# Patient Record
Sex: Female | Born: 1965 | Race: White | Hispanic: No | Marital: Married
Health system: Southern US, Community
[De-identification: ages and names within clinical notes are randomized; demographics above are authoritative.]

---

## 2004-01-15 ENCOUNTER — Other Ambulatory Visit: Admission: RE | Admit: 2004-01-15 | Discharge: 2004-01-15 | Payer: Self-pay | Admitting: Obstetrics and Gynecology

## 2004-09-25 ENCOUNTER — Other Ambulatory Visit: Admission: RE | Admit: 2004-09-25 | Discharge: 2004-09-25 | Payer: Self-pay | Admitting: Obstetrics and Gynecology

## 2005-01-15 ENCOUNTER — Ambulatory Visit: Payer: Self-pay | Admitting: Obstetrics and Gynecology

## 2005-01-22 ENCOUNTER — Ambulatory Visit: Payer: Self-pay | Admitting: Obstetrics and Gynecology

## 2005-01-29 ENCOUNTER — Ambulatory Visit: Payer: Self-pay | Admitting: Obstetrics and Gynecology

## 2005-02-06 ENCOUNTER — Inpatient Hospital Stay (HOSPITAL_COMMUNITY): Admission: AD | Admit: 2005-02-06 | Discharge: 2005-02-13 | Payer: Self-pay | Admitting: Obstetrics and Gynecology

## 2005-02-06 ENCOUNTER — Ambulatory Visit: Payer: Self-pay | Admitting: *Deleted

## 2005-02-08 ENCOUNTER — Encounter (INDEPENDENT_AMBULATORY_CARE_PROVIDER_SITE_OTHER): Payer: Self-pay | Admitting: Specialist

## 2005-02-14 ENCOUNTER — Encounter: Admission: RE | Admit: 2005-02-14 | Discharge: 2005-03-15 | Payer: Self-pay | Admitting: Obstetrics and Gynecology

## 2005-03-16 ENCOUNTER — Encounter: Admission: RE | Admit: 2005-03-16 | Discharge: 2005-04-02 | Payer: Self-pay | Admitting: Obstetrics and Gynecology

## 2005-03-27 ENCOUNTER — Other Ambulatory Visit: Admission: RE | Admit: 2005-03-27 | Discharge: 2005-03-27 | Payer: Self-pay | Admitting: Obstetrics and Gynecology

## 2007-10-31 ENCOUNTER — Encounter: Admission: RE | Admit: 2007-10-31 | Discharge: 2007-10-31 | Payer: Self-pay | Admitting: Obstetrics and Gynecology

## 2008-08-08 ENCOUNTER — Encounter (INDEPENDENT_AMBULATORY_CARE_PROVIDER_SITE_OTHER): Payer: Self-pay | Admitting: Diagnostic Radiology

## 2008-08-08 ENCOUNTER — Encounter: Admission: RE | Admit: 2008-08-08 | Discharge: 2008-08-08 | Payer: Self-pay | Admitting: Obstetrics and Gynecology

## 2010-10-11 NOTE — Discharge Summary (Signed)
Sydney Gomez, Sydney Gomez               ACCOUNT NO.:  [id number]   MEDICAL RECORD NO.:  [id number]          PATIENT TYPE:  INP   LOCATION:  9124                          FACILITY:  [facility]   PHYSICIAN:  Guy Sandifer. Henderson Cloud, M.D. DATE OF BIRTH:  04-08-1966   DATE OF ADMISSION:  02/06/2005  DATE OF DISCHARGE:  02/13/2005                                 DISCHARGE SUMMARY   ADMITTING DIAGNOSES:  1.  Intrauterine pregnancy at 27 and one-seventh weeks estimated gestational      age.  2.  Twin gestation.  3.  Severe pregnancy-induced hypertension.   DISCHARGE DIAGNOSES:  1.  Status post low transverse cesarean section.  2.  Viable female infants.   PROCEDURE:  Primary low transverse cesarean section.   REASON FOR ADMISSION:  Please see dictated H&P.   HOSPITAL COURSE:  The patient was a 45 year old primigravida that was  admitted to [facility] at 5 and one-seventh weeks  estimated gestational age. The patient had been evaluated in the office and  was noted to have elevation in her blood pressure and proteinuria. The  patient was then hospitalized for observation and evaluation regarding  elevated blood pressure and proteinuria. The patient was also noted to have  some discordance in fetal growth with twin A in approximately the 30th  percentile and baby B in the 65th percentile. The patient was started on  betamethasone, a 24-hour urine collection for creatinine clearance and  protein, PIH labs were drawn, and the patient was placed on bedrest. On the  following morning, the patient was without complaint other than orange  urine. A 24-hour urine was ongoing. Blood pressure was noted to be 139/91.  Uterine contractions were noted to be every 3-5 minutes. Cervix was dilated  to 2+, 30% effaced, with vertex A at a -2 station. Later that afternoon, 24-  hour collection had been completed which revealed 3445 mg of protein.  Decision was made to schedule the patient for cesarean  delivery in the  morning and placed the patient on magnesium sulfate. On the following  morning, the patient was nauseated. Vital signs were stable. She was  afebrile. Blood pressure was stable. The patient was then transferred to the  operating room where spinal anesthesia was administered without difficulty.  A low transverse incision was made with the delivery of viable twin A, a  female infant weighing 3 pounds 14 ounces with Apgars of 7 at one minute and 9  at five minutes. Arterial cord pH was 7.35. Baby B, also female, weighing 4  pounds 5 ounces with Apgars of 9 at one minute and 9 at 5 minutes was  delivered without difficulty. The patient tolerated the procedure well and  was taken to the recovery room and later transferred to the AICU where  magnesium sulfate was administered. On postoperative day #1, the patient was  known to have some decrease in oxygen saturation. Chest x-ray had been  performed which revealed some mild pulmonary edema. Lasix had been given IV  with excellent results. The patient was now without complaint. Vital signs  were stable  with blood pressure 120/80. Oxygen saturation was 97% on 2 L.  Urine output was -400 mL over the last 8 hours. Lungs were clear to  auscultation bilaterally. Fundus was firm and nontender with good return of  bowel function. Abdominal dressing was noted to be clean, dry and intact.  Laboratory findings revealed hemoglobin of 9.6; platelet count of 208,000;  wbc count of 17.0. On the following morning, the patient was without  complaint. She denied any shortness of breath. Blood pressure was 162/105 to  151/94, temperature 100.3 maximum. Abdomen was soft. Fundus was firm with  moderate tenderness. Incision was clean, dry and intact. Liver function  tests were within normal limits. On postoperative day #3, the patient did  complain of a productive cough. She denied headache, blurred vision, or  right upper quadrant pain. Vital signs were  stable. She was afebrile. Blood  pressure 140-161 over 92-109. Deep tendon reflexes were 2+. Abdomen was  soft. Fundus was firm and nontender. Incision was clean, dry and intact.  Laboratory findings revealed hemoglobin of 9.0, platelet count of 200, wbc  count of 12.0, liver function tests were within normal limits, and uric acid  was 4.2. On postoperative day #4, the patient continued to complain of a  productive cough. Vital signs were stable. She was afebrile. Blood pressure  156/97. Deep tendon reflexes were 2+, no clonus. Abdomen was soft. Fundus  was firm and nontender. Incision was clean, dry and intact. Staples were  removed. Lungs were clear to auscultation. The patient was started on a Z-  Pak and continued on labetalol 100 mg twice a day. On postoperative day #5,  the patient did complain of a mild headache without blurred vision or  epigastric pain, cough was improving, vital signs were stable. Blood  pressure 146-170 over 92-104. Deep tendon reflexes were 1+. Abdomen was  soft. Fundus was firm and nontender. Incision was clean, dry and intact.  Staples had been removed. She was ambulating well. PIH labs were drawn prior  to discharge which revealed hemoglobin of 9.4; platelet count of 274,000;  wbc count of 11.5. Liver function tests were within normal limits.  Instructions were given and the patient was discharged home.   CONDITION ON DISCHARGE:  Stable.   DIET:  Regular as tolerated.   ACTIVITY:  No heavy lifting, no driving x2 weeks, no vaginal entry.   FOLLOW UP:  The patient is to follow up in the office in 1 week for an  incision check. She is to call for temperature greater than 100 degrees,  persistent nausea and vomiting, heavy vaginal bleeding, and/or redness or  drainage from the incisional site. The patient was also instructed to call  for headache, blurred vision, or right upper quadrant pain.   DISCHARGE MEDICATIONS: 1.  Tylox #30 one p.o. q.4-6h. p.r.n.   2.  Motrin 600 mg daily.  3.  Labetalol 200 mg one p.o. b.i.d.  4.  Prenatal vitamins one p.o. daily.  5.  Colace one p.o. daily p.r.n.      Julio Sicks, N.P.      Guy Sandifer. Henderson Cloud, M.D.  Electronically Signed    CC/MEDQ  D:  03/17/2005  T:  03/17/2005  Job:  [id number]

## 2010-10-11 NOTE — H&P (Signed)
NAMEDENNY, LAVE               ACCOUNT NO.:  [id number]   MEDICAL RECORD NO.:  [id number]          PATIENT TYPE:  INP   LOCATION:  9155                          FACILITY:  [facility]   PHYSICIAN:  Dineen Kid. Rana Snare, M.D.    DATE OF BIRTH:  10-23-1965   DATE OF ADMISSION:  02/06/2005  DATE OF DISCHARGE:                                HISTORY & PHYSICAL   HISTORY OF PRESENT ILLNESS:  Ms. Scaletta is a 45 year old, G1, P0, at 75 and  1/7ths estimated gestational age with an estimated date of confinement of  March 26, 2005, who was seen in the office today by Dr. Marcelle Overlie  for a routine obstetrical visit.  She has had elevated blood pressures over  the last 2-3 weeks.  Today, she has an elevated blood pressure of 148/90  with 2+ proteinuria.  The pregnancy is complicated by twin pregnancies.  Her  baseline blood pressures at 13 weeks were 110-120 systolic over 60-72  diastolic.  She began having occasional elevated blood pressures around 26-  28 weeks.  Ultrasound evaluation at 26-28 weeks reveals concordant growth  with baby A slightly smaller than baby B, ranging in the 59th to 72nd  percentile, and baby B ranging in the 75th to 79th percentile.  Recent  ultrasound on January 29, 2005 has shown baby A now in the 30th percentile  and baby B in the 65th percentile.  She has undergone first trimester  screening and level 2 ultrasound at [facility].  They are  monochorionic diamniotic twins with a thin membrane separating the twin  boys.  Today, she is asymptomatic for preeclampsia.   PAST MEDICAL HISTORY:  1.  Significant for hypothyroidism.  2.  Polycystic ovarian syndrome.   PAST SURGICAL HISTORY:  1.  She had a D&C at age 45.  2.  She has had arthroscopy of both knees.   PHYSICAL EXAMINATION:  VITAL SIGNS:  Blood pressure 140/90, 2+ protein.  Her  weight is 216.  Fetal heart tones were heard today.  PELVIC:  Cervix, per Dr. Vincente Poli, is 1, 30%, -2 station.   IMPRESSION  AND PLAN:  1.  Twin pregnancies at 72 and 1/7ths weeks.  2.  Elevated blood pressure with proteinuria.   PLAN:  Admission to the antenatal unit at [facility] for further  evaluation of preeclampsia and monitoring of the fetuses.  There is now  beginning some discordancy in growth of monochorionic twins, and a question  of decreased weight from the previous growth curve. Will plan betamethasone  12.5 mg IM q.24h., 24-hour urine for creatinine clearance and protein, PIH  lab evaluation, and bed rest.     Dineen Kid. Rana Snare, M.D.  Electronically Signed    DCL/MEDQ  D:  02/06/2005  T:  02/06/2005  Job:  [id number]

## 2010-10-11 NOTE — Op Note (Signed)
NAMEJONQUIL, STUBBE               ACCOUNT NO.:  [id number]   MEDICAL RECORD NO.:  [id number]          PATIENT TYPE:  INP   LOCATION:  9374                          FACILITY:  [facility]   PHYSICIAN:  Michelle L. Grewal, M.D.DATE OF BIRTH:  [birth year]   DATE OF PROCEDURE:  02/08/2005  DATE OF DISCHARGE:                                 OPERATIVE REPORT   PREOPERATIVE DIAGNOSES:  1.  Intrauterine pregnancy at 33-6/7 weeks.  2.  Twins.  3.  Severe pregnancy-induced hypertension.   POSTOPERATIVE DIAGNOSES:  1.  Intrauterine pregnancy at 33-6/7 weeks.  2.  Twins.  3.  Severe pregnancy-induced hypertension.   PROCEDURE:  Primary low transverse cesarean section.   SURGEON:  Dr. Vincente Poli   ANESTHESIA:  Spinal.   Twin A was female infant in vertex presentation, weighing 3 pounds 14 ounces  with a loose nuchal cord x 2.  Twin B was a double footling breech and was a  female infant, weighing 4 pounds and 5 ounce.   ESTIMATED BLOOD LOSS:  500 mL.   DRAINS:  Foley.   COMPLICATIONS:  None.   PROCEDURE:  The patient was taken to the operating room.  She was given her  spinal without incident.  She was prepped and draped in the usual sterile  fashion.  A Foley catheter was inserted.  A sterile drape was applied.  The  low transverse incision was made and carried down to the fascia.  The fascia  was scored in the midline, extended laterally.  The rectus muscles were  separated in the midline, and the peritoneum was entered bluntly.  The  peritoneal incision was stretched.  The bladder blade was inserted; the  lower uterine segment was identified, and the bladder flap was created  sharply and then digitally.  A low transverse incision was made in the  uterus.  The uterus was entered using a hemostat.  The sac of twin A was  ruptured, and the fluid was clear.  The baby was in the cephalic  presentation and was delivered easily.  He was noted to have a loose nuchal  cord x 2, and he weighted 3 pounds  14 ounces.  He was handed to waiting  neonatologist.  After twin A was delivered, we then broke the bag of twin B.  The fluid was also clear, and I reached in, and he was double-footling  breech, and I delivered him via complete breech extraction quite easily.  He  was a female infant, weighing 4 pounds 5 ounces.  Cord was clamped and cut,  and he was also handed to the waiting pediatrician.  The placentas were  manually removed and noted to be normal and intact.  They were sent to  pathology.  The uterus was cleared of all clots and debris.  The uterine  incision was closed using 0 chromic in a continuous running locked stitch  and was hemostatic.  Irrigation was performed.  Hemostasis was noted.  The  peritoneum was closed using 0 Vicryl in a continuous running stitch.  The  rectus muscles were  reapproximated using the  same 0 Vicryl.  The fascia was closed using 0  Vicryl in a continuous stitch, starting at each corner and meeting in the  midline.  After irrigation of subcutaneous layers, the skin was closed with  staples.  All sponge, lap, and instrument counts were correct x 2.  The  patient went to recovery room in stable condition.      Michelle L. Vincente Poli, M.D.  Electronically Signed     MLG/MEDQ  D:  02/08/2005  T:  02/08/2005  Job:  [id number]

## 2010-10-24 IMAGING — US UNKNOWN PR STUDY
1 series · 11 of 11 positions shown · non-contrast
Comparison: 07/31/2008 and 06/25/2007

CLINICAL DATA: Abnormal screening mammogram with possible right
breast mass.

DIGITAL DIAGNOSTIC  RIGHT  MAMMOGRAM   AND RIGHT BREAST
ULTRASOUND:

[Series 1: unknown pr study · 11 of 11 slices shown]
[im 1/11]
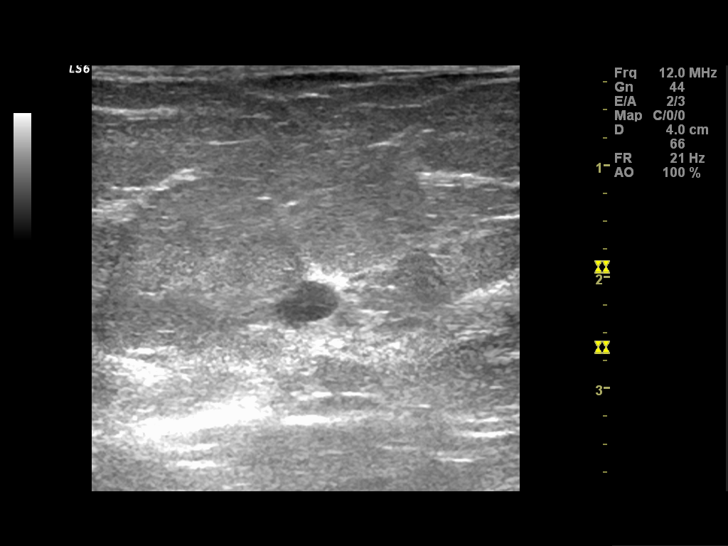
[im 2/11]
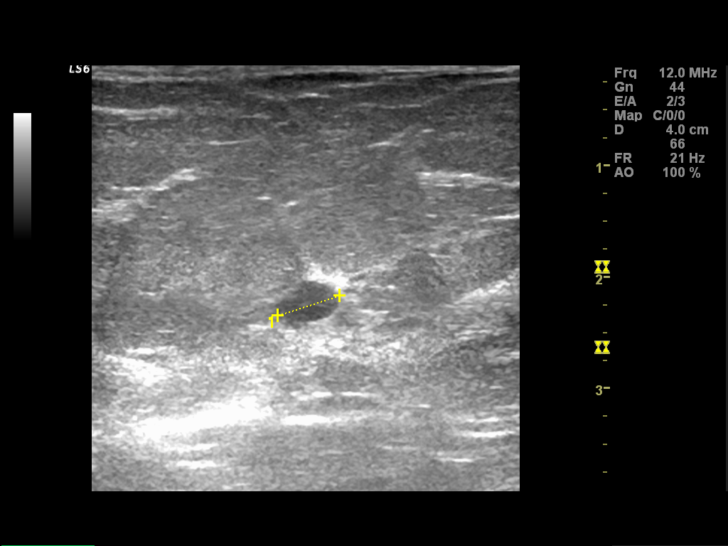
[im 3/11]
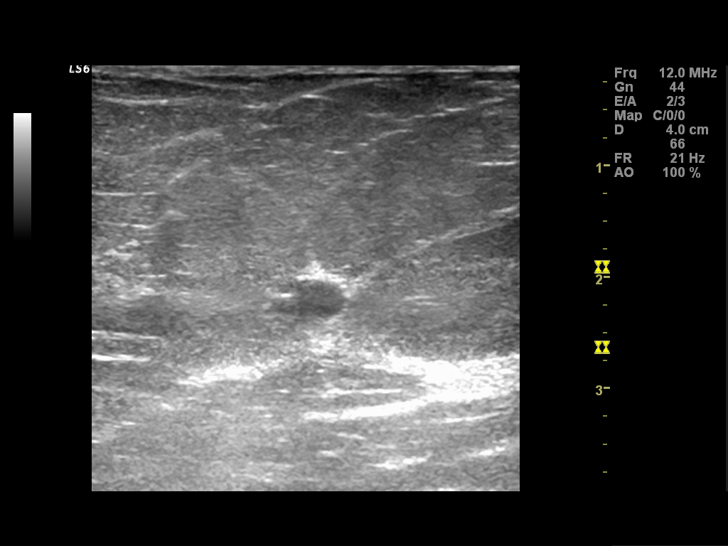
[im 4/11]
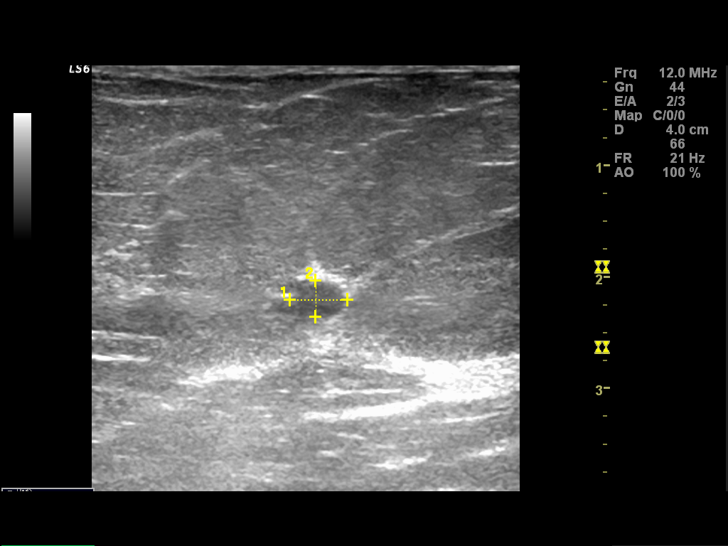
[im 5/11]
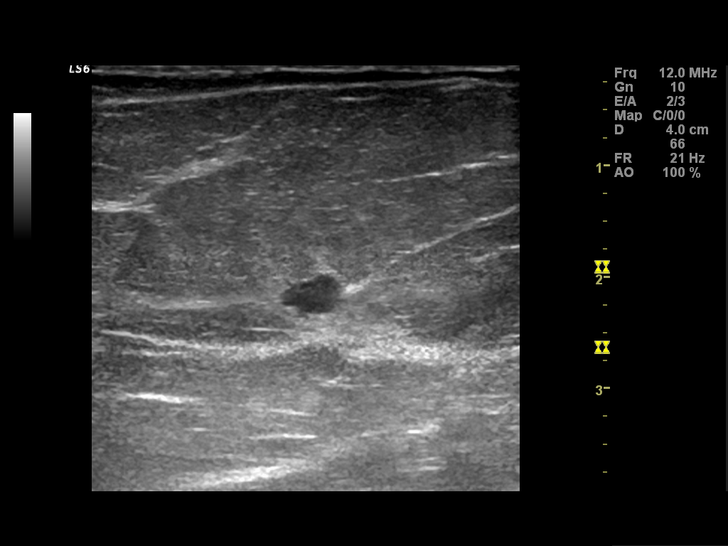
[im 6/11]
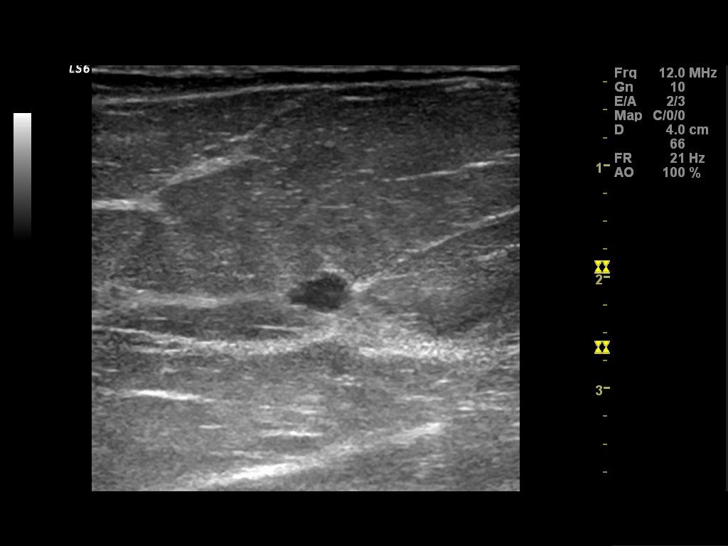
[im 7/11]
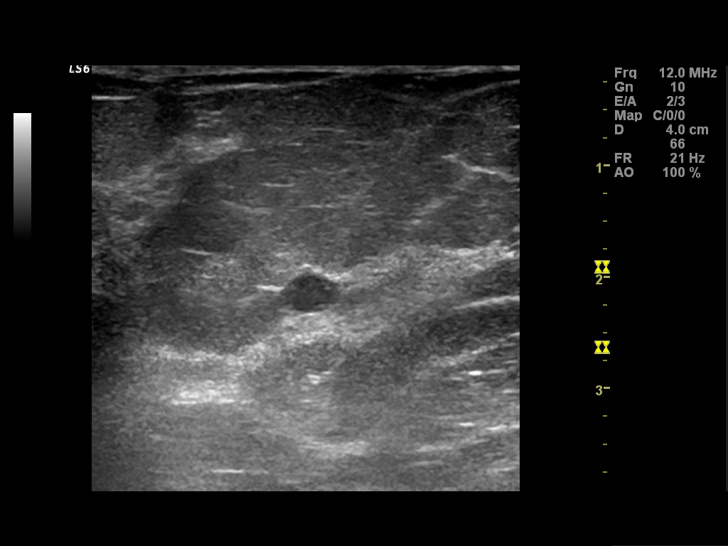
[im 8/11]
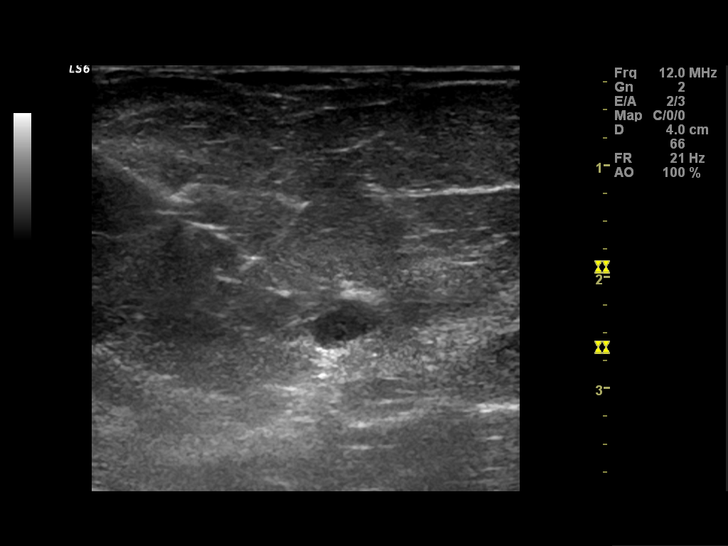
[im 9/11]
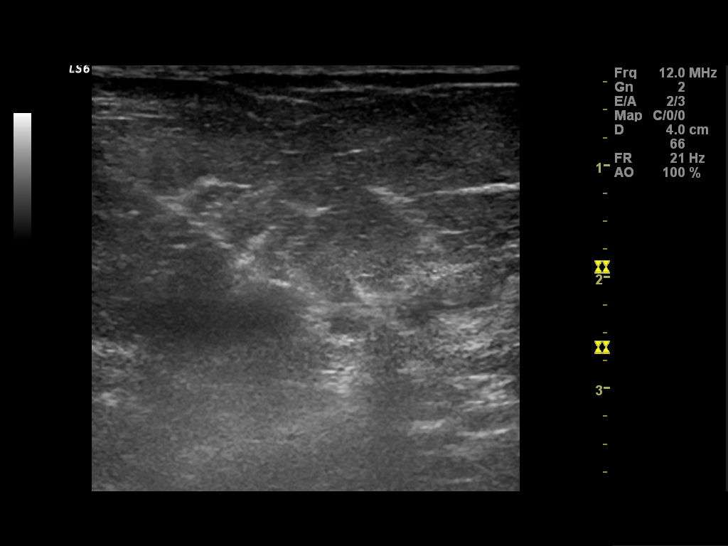
[im 10/11]
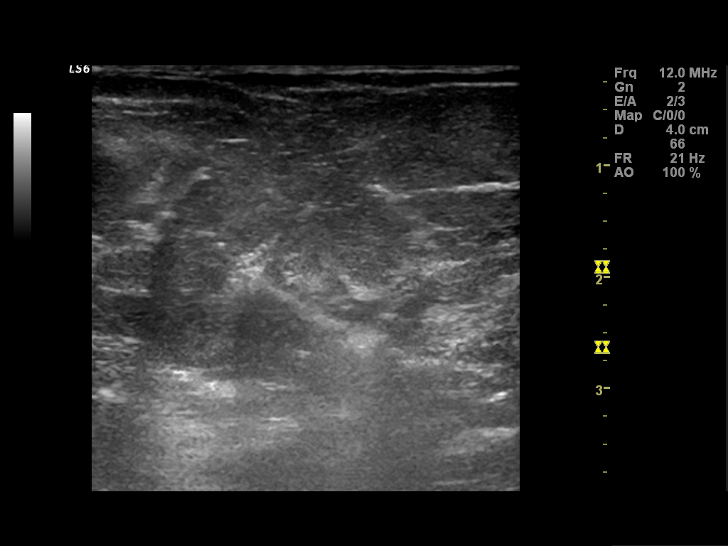
[im 11/11]
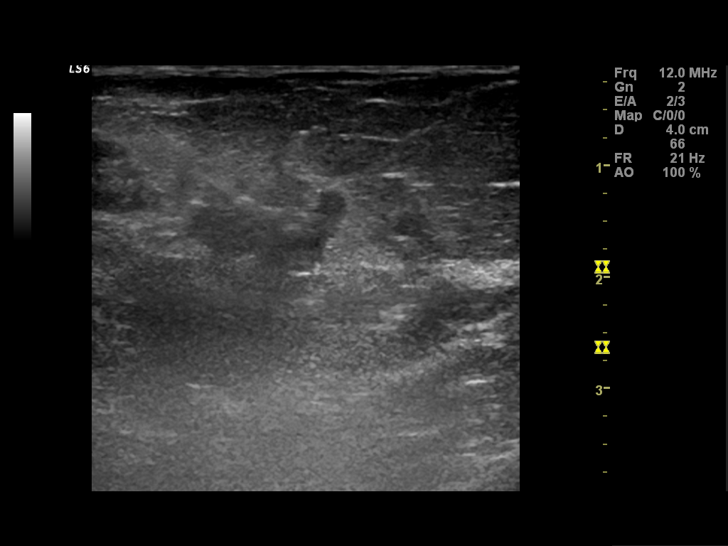

[11 of 11 positions shown; findings below may reference images not displayed]

FINDINGS: CC and MLO spot compression views of the right breast in
the area screening mammogram abnormality demonstrates a 4 x 6 mm
obscured oval nodule in the central right breast.

On physical exam, no palpable abnormalities identified in the right
breast

Ultrasound is performed, showing a 5 x 3 x 6 mm circumscribed
hypoechoic nodule in the 2 o'clock position of the right breast 3
cm from the nipple.  This likely represents a complicated cyst or
fibroadenoma.
IMPRESSION: 5 x 3 x 6 mm likely benign nodule in the upper inner right breast.
These findings were discussed with the patient and options of 6-
month follow-up and needle biopsy were presented to the patient.
With this patient's strong family history of breast cancer, the
patient desires to have an ultrasound-guided needle biopsy of this
right breast nodule.

BI-RADS CATEGORY 3:  Probably benign finding(s) - short interval
follow-up suggested.

Options of 6-month follow-up and needle biopsy were discussed with
the patient and the patient desires to proceed with ultrasound
guided needle biopsy of the right breast nodule, which will be
performed at this time but dictated in a separate report.

## 2013-10-10 ENCOUNTER — Other Ambulatory Visit: Payer: Self-pay | Admitting: Obstetrics and Gynecology

## 2013-10-10 DIAGNOSIS — R928 Other abnormal and inconclusive findings on diagnostic imaging of breast: Secondary | ICD-10-CM

## 2013-10-20 ENCOUNTER — Ambulatory Visit
Admission: RE | Admit: 2013-10-20 | Discharge: 2013-10-20 | Disposition: A | Discharge status: Home / Self Care | Payer: BC Managed Care – PPO | Source: Ambulatory Visit | Attending: Obstetrics and Gynecology | Admitting: Obstetrics and Gynecology

## 2013-10-20 ENCOUNTER — Encounter (INDEPENDENT_AMBULATORY_CARE_PROVIDER_SITE_OTHER): Payer: Self-pay

## 2013-10-20 DIAGNOSIS — R928 Other abnormal and inconclusive findings on diagnostic imaging of breast: Secondary | ICD-10-CM

## 2013-12-02 ENCOUNTER — Other Ambulatory Visit: Payer: Self-pay | Admitting: Gastroenterology

## 2016-01-05 IMAGING — US US BREAST*R* LIMITED INC AXILLA
1 series · 5 of 5 positions shown · non-contrast
Comparison: 08/08/2008, 06/28/2010, 07/23/2011, 07/15/2012,
09/30/2013 mammograms.

CLINICAL DATA: Patient is an asymptomatic 47-year-old female was
recalled from screening mammography to further evaluate the right
breast. She reports a family history of breast cancer diagnosed her
mother at age 59.

EXAM:
DIGITAL DIAGNOSTIC RIGHT MAMMOGRAM
ULTRASOUND RIGHT BREAST

[Series 1: us breast*right* limited inc axilla · 5 of 5 slices shown]
[im 1/5]
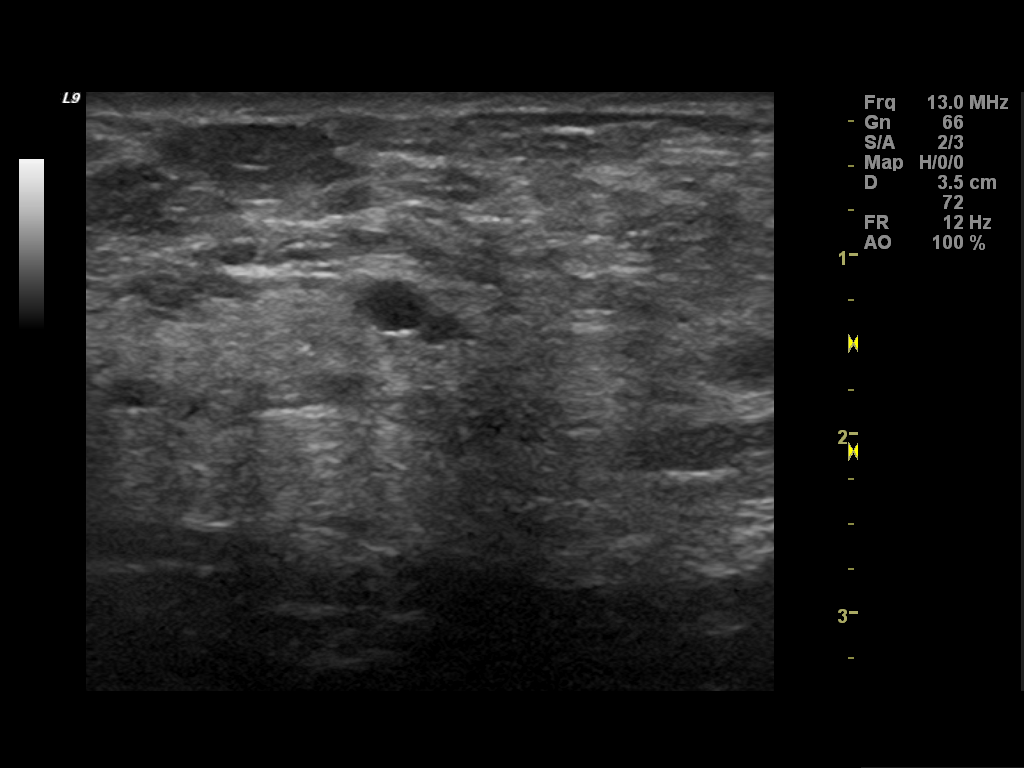
[im 2/5]
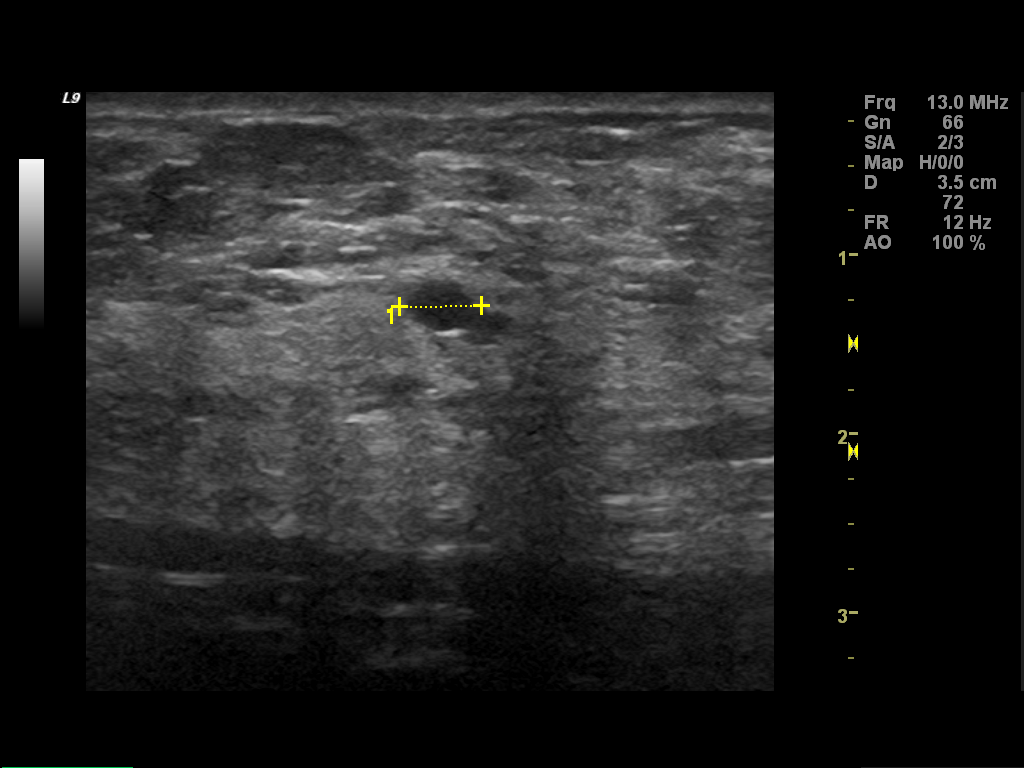
[im 3/5]
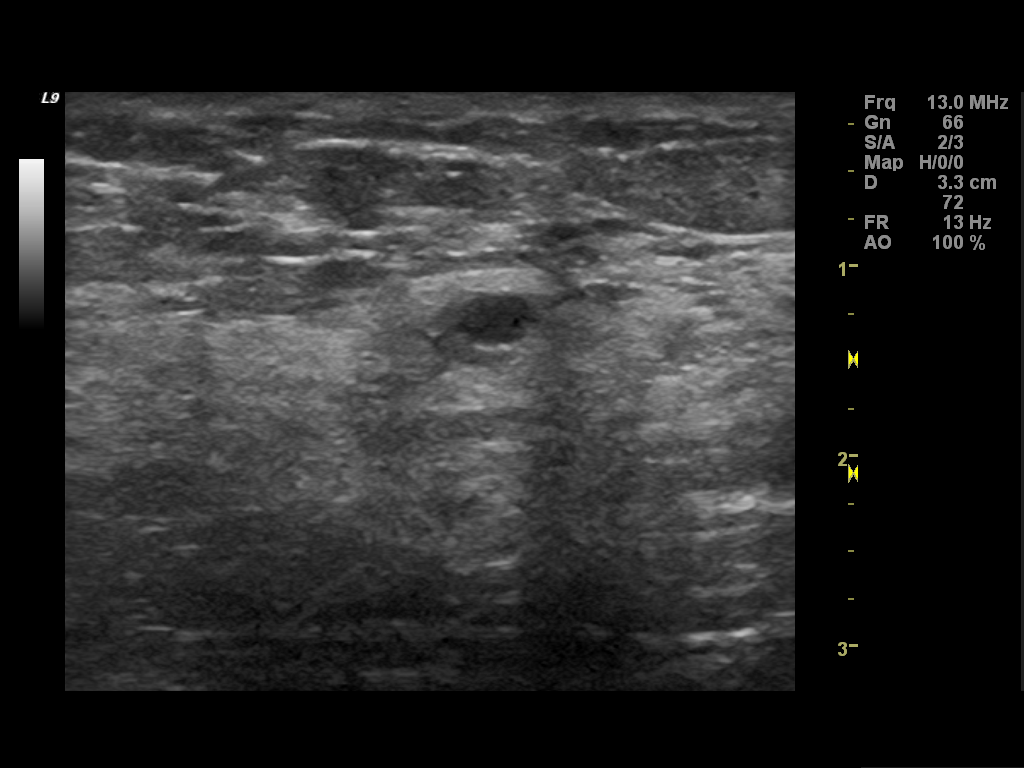
[im 4/5]
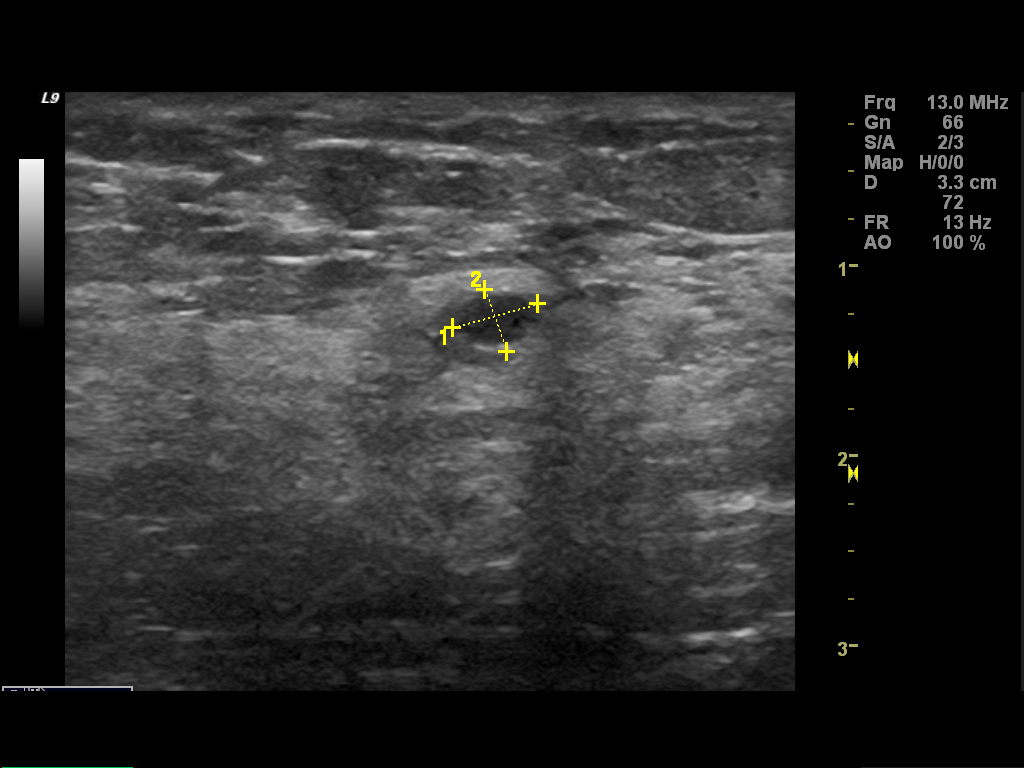
[im 5/5]
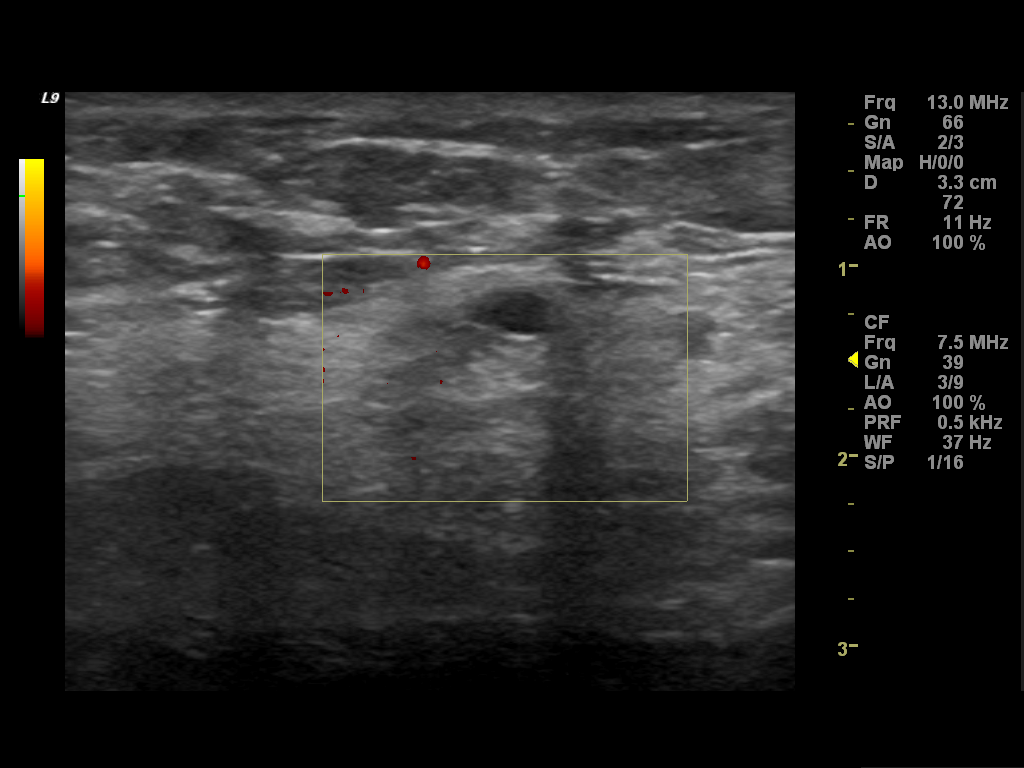

[5 of 5 positions shown; findings below may reference images not displayed]

ACR Breast Density Category c: The breast tissue is heterogeneously
dense, which may obscure small masses.
FINDINGS: Right unilateral digital diagnostic mammography demonstrates a
heterogeneously dense parenchymal pattern which could obscure
detection of small masses. Spot-compression views of the upper-outer
right breast reveal effacement of normal fibroglandular tissue. No
abnormal mass or suspicious microcalcifications.

On physical exam, no focal palpable abnormalities are detected.

Ultrasound of the outer right breast is performed, showing a 5 mm
diameter benign anechoic simple cyst at 10 o'clock 10 cm from the
nipple. This represents a good sonographic correlate to the
previously noted questionable mass seen on screening mammography. No
conspicuous cystic or solid masses identified.
IMPRESSION: No mammographic or sonographic findings of malignancy in the right
breast.

RECOMMENDATION:
Screening mammogram in one year.(Code:[id number])

I have discussed the findings and recommendations with the patient.
Results were also provided in writing at the conclusion of the
visit. If applicable, a reminder letter will be sent to the patient
regarding the next appointment.

BI-RADS CATEGORY  2: Benign.

## 2018-04-28 ENCOUNTER — Other Ambulatory Visit: Payer: Self-pay | Admitting: Obstetrics and Gynecology

## 2018-04-28 DIAGNOSIS — Z803 Family history of malignant neoplasm of breast: Secondary | ICD-10-CM

## 2018-05-03 ENCOUNTER — Other Ambulatory Visit: Payer: Self-pay

## 2020-01-18 ENCOUNTER — Other Ambulatory Visit: Payer: Self-pay | Admitting: Obstetrics and Gynecology

## 2020-01-18 DIAGNOSIS — Z9189 Other specified personal risk factors, not elsewhere classified: Secondary | ICD-10-CM

## 2021-01-21 ENCOUNTER — Other Ambulatory Visit: Payer: Self-pay | Admitting: Obstetrics and Gynecology

## 2021-01-21 DIAGNOSIS — Z9189 Other specified personal risk factors, not elsewhere classified: Secondary | ICD-10-CM

## 2022-04-23 ENCOUNTER — Other Ambulatory Visit: Payer: Self-pay | Admitting: Obstetrics and Gynecology

## 2022-04-23 DIAGNOSIS — Z9189 Other specified personal risk factors, not elsewhere classified: Secondary | ICD-10-CM

## 2022-05-21 ENCOUNTER — Other Ambulatory Visit: Payer: Self-pay

## 2022-06-12 ENCOUNTER — Other Ambulatory Visit: Payer: Self-pay

## 2022-06-13 ENCOUNTER — Other Ambulatory Visit: Payer: Self-pay

## 2022-07-11 ENCOUNTER — Other Ambulatory Visit: Payer: Self-pay

## 2022-08-15 ENCOUNTER — Other Ambulatory Visit: Payer: Self-pay
